# Patient Record
Sex: Male | Born: 1990 | Race: White | Hispanic: No | Marital: Single | Smoking: Former smoker
Health system: Southern US, Community
[De-identification: ages and names within clinical notes are randomized; demographics above are authoritative.]

## PROBLEM LIST (undated history)

## (undated) DIAGNOSIS — E785 Hyperlipidemia, unspecified: Secondary | ICD-10-CM

## (undated) DIAGNOSIS — F909 Attention-deficit hyperactivity disorder, unspecified type: Secondary | ICD-10-CM

---
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
## 2012-10-21 ENCOUNTER — Encounter: Payer: Self-pay | Admitting: Emergency Medicine

## 2012-10-21 ENCOUNTER — Emergency Department
Admission: EM | Admit: 2012-10-21 | Discharge: 2012-10-21 | Disposition: A | Discharge status: Home / Self Care | Payer: BC Managed Care – PPO | Source: Home / Self Care | Attending: Family Medicine | Admitting: Family Medicine

## 2012-10-21 DIAGNOSIS — J069 Acute upper respiratory infection, unspecified: Secondary | ICD-10-CM

## 2012-10-21 DIAGNOSIS — J029 Acute pharyngitis, unspecified: Secondary | ICD-10-CM

## 2012-10-21 LAB — POCT RAPID STREP A (OFFICE): Rapid Strep A Screen: NEGATIVE

## 2012-10-21 MED ORDER — AZITHROMYCIN 250 MG PO TABS: ORAL_TABLET | ORAL | Status: DC

## 2012-10-21 NOTE — ED Provider Notes (Signed)
CSN: [id number]     Arrival date & time 10/21/12  1628 History     First MD Initiated Contact with Patient 10/21/12 1654     Chief Complaint  Patient presents with  . Nasal Congestion  . Headache  . Otalgia  . Generalized Body Aches     HPI Comments: Reports 2-3 days of sniffling, congestion, alternating ear pain, headaches and body aches, with minor sore throat.  He is leaving for college in 5 days.  The history is provided by the patient.    History reviewed. No pertinent past medical history. History reviewed. No pertinent past surgical history. History reviewed. No pertinent family history. History  Substance Use Topics  . Smoking status: Not on file  . Smokeless tobacco: Not on file  . Alcohol Use: Not on file    Review of Systems + sore throat No cough No pleuritic pain No wheezing + nasal congestion ? post-nasal drainage No sinus pain/pressure No itchy/red eyes ? earache No hemoptysis No SOB No fever/chills No nausea No vomiting No abdominal pain No diarrhea No urinary symptoms No skin rashes No fatigue + myalgias + headache    Allergies  Review of patient's allergies indicates no known allergies.  Home Medications   Current Outpatient Rx  Name  Route  Sig  Dispense  Refill  . azithromycin (ZITHROMAX Z-PAK) 250 MG tablet      Take 2 tabs today; then begin one tab once daily for 4 more days. (Rx void after 10/29/12)   6 each   0    BP 117/72  Pulse 98  Temp(Src) 98.3 F (36.8 C) (Oral)  Resp 18  Ht 6\' 2"  (1.88 m)  Wt 170 lb (77.111 kg)  BMI 21.82 kg/m2  SpO2 100% Physical Exam Nursing notes and Vital Signs reviewed. Appearance:  Patient appears healthy, stated age, and in no acute distress Eyes:  Pupils are equal, round, and reactive to light and accomodation.  Extraocular movement is intact.  Conjunctivae are not inflamed  Ears:  Canals normal.  Tympanic membranes normal.  Nose:  Mildly congested turbinates.  No sinus tenderness.   Pharynx:  Normal Neck:  Supple.  Slightly tender shotty anterior/posterior nodes are palpated bilaterally  Lungs:  Clear to auscultation.  Breath sounds are equal.  Heart:  Regular rate and rhythm without murmurs, rubs, or gallops.  Abdomen:  Nontender without masses or hepatosplenomegaly.  Bowel sounds are present.  No CVA or flank tenderness.  Extremities:  No edema.  No calf tenderness Skin:  No rash present.   ED Course   Procedures  none  Labs Reviewed  STREP A DNA PROBE negative  POCT RAPID STREP A (OFFICE) pending    1. Acute pharyngitis   2. Viral upper respiratory infection     MDM  There is no evidence of bacterial infection today.   Throat culture pending If increasing cough and congestion develop: Begin Mucinex D (guaifenesin with decongestant) twice daily for congestion.  Increase fluid intake, rest. May use Afrin nasal spray (or generic oxymetazoline) twice daily for about 5 days.  Also recommend using saline nasal spray several times daily and saline nasal irrigation (AYR is a common brand) Stop all antihistamines for now, and other non-prescription cough/cold preparations. May take Delsym Cough Suppressant at bedtime for nighttime cough.  Begin Azithromycin if not improving about one week or if persistent fever develops (Given a prescription to hold, with an expiration date)  [facility] not improving  about10 to 14 days.  Lattie Haw, MD 10/21/12 [phone]

## 2012-10-21 NOTE — ED Notes (Signed)
Reports 2-3 days of sniffling, congestion, alternating ear pain, headaches and body aches, with minor sore throat.

## 2012-10-22 LAB — STREP A DNA PROBE: GASP: NEGATIVE

## 2012-10-24 ENCOUNTER — Telehealth: Payer: Self-pay | Admitting: *Deleted

## 2016-01-15 DIAGNOSIS — Z209 Contact with and (suspected) exposure to unspecified communicable disease: Secondary | ICD-10-CM | POA: Diagnosis not present

## 2016-01-15 DIAGNOSIS — Z0289 Encounter for other administrative examinations: Secondary | ICD-10-CM | POA: Diagnosis not present

## 2016-01-15 DIAGNOSIS — S2341XA Sprain of ribs, initial encounter: Secondary | ICD-10-CM | POA: Diagnosis not present

## 2016-03-21 DIAGNOSIS — Z6821 Body mass index (BMI) 21.0-21.9, adult: Secondary | ICD-10-CM | POA: Diagnosis not present

## 2016-03-21 DIAGNOSIS — R509 Fever, unspecified: Secondary | ICD-10-CM | POA: Diagnosis not present

## 2016-03-21 DIAGNOSIS — R52 Pain, unspecified: Secondary | ICD-10-CM | POA: Diagnosis not present

## 2016-03-21 DIAGNOSIS — J101 Influenza due to other identified influenza virus with other respiratory manifestations: Secondary | ICD-10-CM | POA: Diagnosis not present

## 2016-10-18 ENCOUNTER — Ambulatory Visit (INDEPENDENT_AMBULATORY_CARE_PROVIDER_SITE_OTHER): Payer: BC Managed Care – PPO | Admitting: Sports Medicine

## 2016-10-18 ENCOUNTER — Encounter: Payer: Self-pay | Admitting: Sports Medicine

## 2016-10-18 DIAGNOSIS — Z87891 Personal history of nicotine dependence: Secondary | ICD-10-CM

## 2016-10-18 DIAGNOSIS — Z87898 Personal history of other specified conditions: Secondary | ICD-10-CM

## 2016-10-18 DIAGNOSIS — F1011 Alcohol abuse, in remission: Secondary | ICD-10-CM | POA: Insufficient documentation

## 2016-10-18 DIAGNOSIS — Z0000 Encounter for general adult medical examination without abnormal findings: Secondary | ICD-10-CM | POA: Insufficient documentation

## 2016-10-18 LAB — TSH: TSH: 3.27 m[IU]/L (ref 0.40–4.50)

## 2016-10-18 LAB — CBC
HCT: 43.6 % (ref 38.5–50.0)
Hemoglobin: 14.8 g/dL (ref 13.2–17.1)
MCH: 29.3 pg (ref 27.0–33.0)
MCHC: 33.9 g/dL (ref 32.0–36.0)
MCV: 86.3 fL (ref 80.0–100.0)
MPV: 8.3 fL (ref 7.5–12.5)
Platelets: 264 K/uL (ref 140–400)
RBC: 5.05 MIL/uL (ref 4.20–5.80)
RDW: 12.8 % (ref 11.0–15.0)
WBC: 4.4 K/uL (ref 3.8–10.8)

## 2016-10-18 NOTE — Assessment & Plan Note (Signed)
Sober for a long time now. We certainly have options such as Campral and disulfiram if the desire comes back. Does have some weird tastes in his mouth, checking vitamin B12 levels in addition to the other above blood work.

## 2016-10-18 NOTE — Assessment & Plan Note (Signed)
Quit for one month now.

## 2016-10-18 NOTE — Assessment & Plan Note (Signed)
Adding routine labs. Up-to-date on vaccinations. Return in one year.

## 2016-10-18 NOTE — Progress Notes (Signed)
  Subjective:    CC: Establish care/New patient physical.   HPI:  Joseph Cole is a pleasant 26 year old male, he does have a history of alcohol abuse, smoking, he is several months sober from alcohol and he quit smoking last month. He really has no complaints. [place] [employer] [address], failed some classes, and is now enrolled in [employer] studying finance. Overall he is happy, doing well, has a girlfriend, and no complaints.  Past medical history:  Negative.  See flowsheet/record as well for more information.  Surgical history: Negative.  See flowsheet/record as well for more information.  Family history: Negative.  See flowsheet/record as well for more information.  Social history: Negative.  See flowsheet/record as well for more information.  Allergies, and medications have been entered into the medical record, reviewed, and no changes needed.    Review of Systems: No headache, visual changes, nausea, vomiting, diarrhea, constipation, dizziness, abdominal pain, skin rash, fevers, chills, night sweats, swollen lymph nodes, weight loss, chest pain, body aches, joint swelling, muscle aches, shortness of breath, mood changes, visual or auditory hallucinations.  Objective:    General: Well Developed, well nourished, and in no acute distress.  Neuro: Alert and oriented x3, extra-ocular muscles intact, sensation grossly intact. Cranial nerves II through XII are intact, motor, sensory, and coordinative functions are all intact. HEENT: Normocephalic, atraumatic, pupils equal round reactive to light, neck supple, no masses, no lymphadenopathy, thyroid nonpalpable. Oropharynx, nasopharynx, external ear canals are unremarkable. Skin: Warm and dry, no rashes noted.  Cardiac: Regular rate and rhythm, no murmurs rubs or gallops.  Respiratory: Clear to auscultation bilaterally. Not using accessory muscles, speaking in full sentences.  Abdominal: Soft, nontender, nondistended, positive bowel  sounds, no masses, no organomegaly.  Musculoskeletal: Shoulder, elbow, wrist, hip, knee, ankle stable, and with full range of motion.  Impression and Recommendations:    The patient was counselled, risk factors were discussed, anticipatory guidance given.  Annual physical exam Adding routine labs. Up-to-date on vaccinations. Return in one year.  Former smoker Quit for one month now.  History of alcohol abuse Sober for a long time now. We certainly have options such as Campral and disulfiram if the desire comes back. Does have some weird tastes in his mouth, checking vitamin B12 levels in addition to the other above blood work.

## 2016-10-19 LAB — COMPREHENSIVE METABOLIC PANEL WITH GFR
ALT: 17 U/L (ref 9–46)
Albumin: 4.9 g/dL (ref 3.6–5.1)
Alkaline Phosphatase: 118 U/L — ABNORMAL HIGH (ref 40–115)
BUN: 14 mg/dL (ref 7–25)
Potassium: 4.2 mmol/L (ref 3.5–5.3)
Total Protein: 7.6 g/dL (ref 6.1–8.1)

## 2016-10-19 LAB — LIPID PANEL W/REFLEX DIRECT LDL
Cholesterol: 169 mg/dL (ref <200)
HDL: 41 mg/dL (ref >40)
LDL-Cholesterol: 112 mg/dL — ABNORMAL HIGH
Non-HDL Cholesterol (Calc): 128 mg/dL (ref <130)
Total CHOL/HDL Ratio: 4.1 Ratio (ref <5.0)
Triglycerides: 74 mg/dL (ref <150)

## 2016-10-19 LAB — COMPREHENSIVE METABOLIC PANEL
AST: 14 U/L (ref 10–40)
CO2: 20 mmol/L (ref 20–32)
Calcium: 9.7 mg/dL (ref 8.6–10.3)
Chloride: 106 mmol/L (ref 98–110)
Creat: 0.82 mg/dL (ref 0.60–1.35)
Glucose, Bld: 93 mg/dL (ref 65–99)
Sodium: 140 mmol/L (ref 135–146)
Total Bilirubin: 0.4 mg/dL (ref 0.2–1.2)

## 2016-10-19 LAB — HEMOGLOBIN A1C
Hgb A1c MFr Bld: 4.9 % (ref <5.7)
Mean Plasma Glucose: 94 mg/dL

## 2016-10-19 LAB — HIV ANTIBODY (ROUTINE TESTING W REFLEX): HIV 1&2 Ab, 4th Generation: NONREACTIVE

## 2016-10-19 LAB — VITAMIN D 25 HYDROXY (VIT D DEFICIENCY, FRACTURES): Vit D, 25-Hydroxy: 28 ng/mL — ABNORMAL LOW (ref 30–100)

## 2016-10-19 LAB — VITAMIN B12: Vitamin B-12: 602 pg/mL (ref 200–1100)

## 2017-06-20 ENCOUNTER — Encounter: Payer: Self-pay | Admitting: Sports Medicine

## 2017-06-20 ENCOUNTER — Ambulatory Visit (INDEPENDENT_AMBULATORY_CARE_PROVIDER_SITE_OTHER): Payer: BLUE CROSS/BLUE SHIELD | Admitting: Sports Medicine

## 2017-06-20 ENCOUNTER — Ambulatory Visit: Payer: BC Managed Care – PPO | Admitting: Sports Medicine

## 2017-06-20 DIAGNOSIS — L74513 Primary focal hyperhidrosis, soles: Secondary | ICD-10-CM

## 2017-06-20 DIAGNOSIS — L74512 Primary focal hyperhidrosis, palms: Secondary | ICD-10-CM | POA: Diagnosis not present

## 2017-06-20 MED ORDER — ALUMINUM CHLORIDE 20 % EX SOLN
CUTANEOUS | 3 refills | Status: DC
Start: 2017-06-20 — End: 2017-06-22

## 2017-06-20 NOTE — Patient Instructions (Signed)
Hyperhidrosis  It is normal to sweat when you are hot, being physically active, or feeling anxious. Sweating is a necessary function for your body. However, hyperhidrosis is when you sweat too much (excessively). Although hyperhidrosis is not dangerous, it can make you feel embarrassed.  There are two kinds of hyperhidrosis:   Primary hyperhidrosis. The sweating usually localizes in one part of your body, such as your underarms, or in a few areas, such as your feet, face, armpits, and hands. This is the more common kind of hyperhidrosis.   Secondary hyperhidrosis. This type more likely affects your entire body.    What are the causes?  The cause of your hyperhidrosis depends on the kind you have.   Primary hyperhidrosis may be caused by having sweat glands that are more active than normal.   Secondary hyperhidrosis is caused by an underlying condition. Possible conditions include:  ? Diabetes.  ? Gout.  ? Certain medicines.  ? Anxiety.  ? Stroke.  ? Obesity.  ? Menopause.  ? Overactive thyroid (hyperthyroidism).  ? Tumors.  ? Frostbite.  ? Certain types of cancers.  ? Alcoholism.  ? Injury to your nervous system.  ? Stroke.  ? Parkinson disease.    What increases the risk?  You may be at an increased risk for primary hyperhidrosis if you have a family history of it.  What are the signs or symptoms?  General symptoms of hyperhidrosis may include:   Feeling like you are sweating constantly, even while you are resting.   Having skin that peels or gets paler or softer in the areas where you sweat the most.   Being able to see sweat on your skin.    Symptoms of primary hyperhidrosis may include:   Sweating in specific areas, such as your armpits, palms, feet, and face.   Sweating in the same location on both sides of your body.   Sweating only during the day.    Symptoms of secondary hyperhidrosis may include:   Sweating all over your body.   Sweating even while you sleep.    How is this  diagnosed?  Hyperhidrosis may be diagnosed by:   Medical history and physical exam.   Testing, such as:  ? Sweat test.  ? Paper test.    How is this treated?  Your treatment will depend on the kind of hyperhidrosis you have and the parts of your body that are affected. If your hyperhidrosis is caused by an underlying condition, your treatment will address the cause. Treatment may include:   Strong antiperspirants. Your health care provider may give you a prescription.   Medicines taken by mouth.   Medicines injected by your health care provider. These may include small amounts of botulinum toxin.   Iontophoresis. This is a procedure that temporarily turns off the sweat glands in your hands and feet.   Surgery to remove your sweat glands.   Sympathectomy. This is a procedure that cuts or destroys your nerves so that they do not send a signal to sweat.    Follow these instructions at home:   Take medicines only as directed by your health care provider.   Use antiperspirants as directed by your health care provider.   Limit or avoid foods or beverages that seem to increase your chances of sweating, such as:  ? Spicy food.  ? Caffeine.  ? Alcohol.  ? Foods that contain MSG.   If your feet sweat:  ? Wear sandals, when possible.  ?   health care provider. Make sure you discuss any questions you have with your health care provider. Document Released: 04/28/2005 Document Revised: 08/05/2015 Document Reviewed: 10/07/2013 Elsevier Interactive Patient Education  2018 [employer].   Aluminum Chloride topical solution What is this  medicine? Aluminum Chloride (a LOO mi num klor ide) is used to control excessive sweating. This medicine may be used for other purposes; ask your health care provider or pharmacist if you have questions. COMMON BRAND NAME(S): Drysol, Hypercare, Darleene Cleaver What should I tell my health care provider before I take this medicine? They need to know if you have any of these conditions: -an unusual or allergic reaction to aluminum chloride, other medicines, foods, dyes, or preservatives -pregnant or trying to get pregnant -breast-feeding How should I use this medicine? This medicine is for external use only. Follow the directions on the prescription label. Make sure the skin is dry before use. Apply to the affected area as directed by your doctor or health care professional, usually at bedtime. Avoid contact with broken, irritated or recently shaved skin. Do not use your medicine more often than directed. Talk to your pediatrician regarding the use of this medicine in children. Special care may be needed. Overdosage: If you think you have taken too much of this medicine contact a poison control center or emergency room at once. NOTE: This medicine is only for you. Do not share this medicine with others. What if I miss a dose? If you miss a dose, use it as soon as you can. If it is almost time for your next dose, use only that dose. Do not use double or extra doses. What may interact with this medicine? Interactions are not expected. Do not use any other skin products on the affected area without asking your doctor or health care professional. This list may not describe all possible interactions. Give your health care provider a list of all the medicines, herbs, non-prescription drugs, or dietary supplements you use. Also tell them if you smoke, drink alcohol, or use illegal drugs. Some items may interact with your medicine. What should I watch for while using this medicine? You may notice a decrease in  sweating after two treatments. Call your doctor or health care professional if your condition does not start to get better or if it gets worse. To help increase the effect of this medicine, your doctor or health care professional may tell you to cover the treated area with saran wrap held in place by a snug fitting t-shirt, mitten or sock. Do not use tape to hold the saran wrap in place. This medicine may discolor fabrics and may be harmful to certain metals. Avoid contact with clothing and jewelry. Do not use this medicine near open flame. What side effects may I notice from receiving this medicine? Side effects that you should report to your doctor or health care professional as soon as possible: -allergic reactions like skin rash, itching or hives, swelling of the face, lips, or tongue -excessive irritation or sensitivity Side effects that usually do not require medical attention (report to your doctor or health care professional if they continue or are bothersome): -mild irritation This list may not describe all possible side effects. Call your doctor for medical advice about side effects. You may report side effects to FDA at 1-800-FDA-1088. Where should I keep my medicine? Keep out of the reach of children. Store at room temperature between 15 and 30 degrees C (59 and 86 degrees C). Throw away  any unused medicine after the expiration date. NOTE: This sheet is a summary. It may not cover all possible information. If you have questions about this medicine, talk to your doctor, pharmacist, or health care provider.  2018 Elsevier/Gold Standard (2013-01-01 17:30:08)

## 2017-06-20 NOTE — Progress Notes (Signed)
Subjective:    CC: Sweaty palms and soles  HPI: This is a pleasant 27 year old male, all of his life he has had excessively sweaty palms, soles to the point it is embarrassing to make physical contact.  He really does not have any issues with sweating in the axilla, back, chest.  Over-the-counter antiperspirants have been ineffective and he is wondering if we have a more efficacious solution.  Symptoms are severe, persistent.  I reviewed the past medical history, family history, social history, surgical history, and allergies today and no changes were needed.  Please see the problem list section below in epic for further details.  Past Medical History: No past medical history on file. Past Surgical History: No past surgical history on file. Social History: Social History   Socioeconomic History  . Marital status: Single    Spouse name: Not on file  . Number of children: Not on file  . Years of education: Not on file  . Highest education level: Not on file  Occupational History  . Not on file  Social Needs  . Financial resource strain: Not on file  . Food insecurity:    Worry: Not on file    Inability: Not on file  . Transportation needs:    Medical: Not on file    Non-medical: Not on file  Tobacco Use  . Smoking status: Former Smoker    Types: Cigarettes    Last attempt to quit: 09/12/2016    Years since quitting: 0.7  . Smokeless tobacco: Never Used  Substance and Sexual Activity  . Alcohol use: Not on file  . Drug use: No  . Sexual activity: Yes    Partners: Female  Lifestyle  . Physical activity:    Days per week: Not on file    Minutes per session: Not on file  . Stress: Not on file  Relationships  . Social connections:    Talks on phone: Not on file    Gets together: Not on file    Attends religious service: Not on file    Active member of club or organization: Not on file    Attends meetings of clubs or organizations: Not on file    Relationship status:  Not on file  Other Topics Concern  . Not on file  Social History Narrative  . Not on file   Family History: Family History  Problem Relation Age of Onset  . Depression Mother   . Hypercholesterolemia Mother   . Hypertension Father   . Depression Sister    Allergies: No Known Allergies Medications: See med rec.  Review of Systems: No fevers, chills, night sweats, weight loss, chest pain, or shortness of breath.   Objective:    General: Well Developed, well nourished, and in no acute distress.  Neuro: Alert and oriented x3, extra-ocular muscles intact, sensation grossly intact.  HEENT: Normocephalic, atraumatic, pupils equal round reactive to light, neck supple, no masses, no lymphadenopathy, thyroid nonpalpable.  Skin: Warm and dry, no rashes.  His palms feel very sweaty and shaking his hand. Cardiac: Regular rate and rhythm, no murmurs rubs or gallops, no lower extremity edema.  Respiratory: Clear to auscultation bilaterally. Not using accessory muscles, speaking in full sentences.  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  I spent 25 minutes with this patient, greater than 50% was face-to-face time counseling regarding the above diagnoses ___________________________________________ Ihor Austinhomas J. Benjamin Stainhekkekandam, M.D., ABFM., CAQSM. Primary [facility] Sports [facility]  [place] Instructor of [employer] [facility] [employer]

## 2017-06-20 NOTE — Assessment & Plan Note (Signed)
Adding topical Drysol. 

## 2017-06-22 ENCOUNTER — Encounter: Payer: Self-pay | Admitting: Sports Medicine

## 2017-06-22 DIAGNOSIS — L74513 Primary focal hyperhidrosis, soles: Principal | ICD-10-CM

## 2017-06-22 DIAGNOSIS — L74512 Primary focal hyperhidrosis, palms: Secondary | ICD-10-CM

## 2017-06-22 MED ORDER — ALUMINUM CHLORIDE 20 % EX SOLN: CUTANEOUS | 3 refills | Status: AC

## 2017-06-22 MED FILL — DRYSOL DAB-O-MATIC SOLUTION: 20 | 30 days supply | Qty: 35 | Fill #0

## 2017-10-11 DIAGNOSIS — F419 Anxiety disorder, unspecified: Secondary | ICD-10-CM | POA: Diagnosis not present

## 2017-11-02 ENCOUNTER — Ambulatory Visit: Payer: BLUE CROSS/BLUE SHIELD | Admitting: Sports Medicine

## 2017-11-02 DIAGNOSIS — F329 Major depressive disorder, single episode, unspecified: Secondary | ICD-10-CM | POA: Diagnosis not present

## 2017-11-02 DIAGNOSIS — L74513 Primary focal hyperhidrosis, soles: Secondary | ICD-10-CM | POA: Diagnosis not present

## 2017-11-02 DIAGNOSIS — F32A Depression, unspecified: Secondary | ICD-10-CM | POA: Insufficient documentation

## 2017-11-02 DIAGNOSIS — L74512 Primary focal hyperhidrosis, palms: Secondary | ICD-10-CM

## 2017-11-02 DIAGNOSIS — F419 Anxiety disorder, unspecified: Secondary | ICD-10-CM

## 2017-11-02 MED ORDER — SERTRALINE HCL 50 MG PO TABS
50.0000 mg | ORAL_TABLET | Freq: Every day | ORAL | 3 refills | Status: DC
Start: 2017-11-02 — End: 2017-12-07

## 2017-11-02 NOTE — Assessment & Plan Note (Signed)
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 Referral to dermatology to discuss Botox treatment.

## 2017-11-02 NOTE — Assessment & Plan Note (Signed)
Continue behavioral therapy, adding Zoloft 50. Return to see me in 4 weeks to repeat PHQ and GAD.

## 2017-11-02 NOTE — Patient Instructions (Signed)
OnabotulinumtoxinA injection (Medical Use) What is this medicine? ONABOTULINUMTOXINA (o na BOTT you lye num tox in eh) is a neuro-muscular blocker. This medicine is used to treat crossed eyes, eyelid spasms, severe neck muscle spasms, ankle and toe muscle spasms, and elbow, wrist, and finger muscle spasms. It is also used to treat excessive underarm sweating, to prevent chronic migraine headaches, and to treat loss of bladder control due to neurologic conditions such as multiple sclerosis or spinal cord injury. This medicine may be used for other purposes; ask your health care provider or pharmacist if you have questions. COMMON BRAND NAME(S): Botox What should I tell my health care provider before I take this medicine? They need to know if you have any of these conditions: -breathing problems -cerebral palsy spasms -difficulty urinating -heart problems -history of surgery where this medicine is going to be used -infection at the site where this medicine is going to be used -myasthenia gravis or other neurologic disease -nerve or muscle disease -surgery plans -take medicines that treat or prevent blood clots -thyroid problems -an unusual or allergic reaction to botulinum toxin, albumin, other medicines, foods, dyes, or preservatives -pregnant or trying to get pregnant -breast-feeding How should I use this medicine? This medicine is for injection into a muscle. It is given by a health care professional in a hospital or clinic setting. Talk to your pediatrician regarding the use of this medicine in children. While this drug may be prescribed for children as young as 12 years old for selected conditions, precautions do apply. Overdosage: If you think you have taken too much of this medicine contact a poison control center or emergency room at once. NOTE: This medicine is only for you. Do not share this medicine with others. What if I miss a dose? This does not apply. What may interact with  this medicine? -aminoglycoside antibiotics like gentamicin, neomycin, tobramycin -muscle relaxants -other botulinum toxin injections This list may not describe all possible interactions. Give your health care provider a list of all the medicines, herbs, non-prescription drugs, or dietary supplements you use. Also tell them if you smoke, drink alcohol, or use illegal drugs. Some items may interact with your medicine. What should I watch for while using this medicine? Visit your doctor for regular check ups. This medicine will cause weakness in the muscle where it is injected. Tell your doctor if you feel unusually weak in other muscles. Get medical help right away if you have problems with breathing, swallowing, or talking. This medicine might make your eyelids droop or make you see blurry or double. If you have weak muscles or trouble seeing do not drive a car, use machinery, or do other dangerous activities. This medicine contains albumin from human blood. It may be possible to pass an infection in this medicine, but no cases have been reported. Talk to your doctor about the risks and benefits of this medicine. If your activities have been limited by your condition, go back to your regular routine slowly after treatment with this medicine. What side effects may I notice from receiving this medicine? Side effects that you should report to your doctor or health care professional as soon as possible: -allergic reactions like skin rash, itching or hives, swelling of the face, lips, or tongue -breathing problems -changes in vision -chest pain or tightness -eye irritation, pain -fast, irregular heartbeat -infection -numbness -speech problems -swallowing problems -unusual weakness Side effects that usually do not require medical attention (report to your doctor or health care   professional if they continue or are bothersome): -bruising or pain at site where injected -drooping eyelid -dry eyes or  mouth -headache -muscles aches, pains -sensitivity to light -tearing This list may not describe all possible side effects. Call your doctor for medical advice about side effects. You may report side effects to FDA at 1-800-FDA-1088. Where should I keep my medicine? This drug is given in a hospital or clinic and will not be stored at home. NOTE: This sheet is a summary. It may not cover all possible information. If you have questions about this medicine, talk to your doctor, pharmacist, or health care provider.  2018 Elsevier/Gold Standard (2014-04-07 15:43:53)  

## 2017-11-02 NOTE — Progress Notes (Signed)
Subjective:    CC: Anxiety  HPI: Joseph Cole is a pleasant 27 year old male, he has a long history of feeling uneasy, anxiety symptoms, mild panic.  We have been trying a nonpharmacologic approach initially with behavioral therapy which has been moderately efficacious.  Fortunately he continues to have a baseline level of uneasiness that he is not okay with.  His girlfriend has been taking Zoloft and he is wondering if this would be useful for him.  No suicidal or homicidal ideation.  Hyperhidrosis: Palms and soles, initial good response to Drysol, but now the effect is waning, even with daily use.  I reviewed the past medical history, family history, social history, surgical history, and allergies today and no changes were needed.  Please see the problem list section below in epic for further details.  Past Medical History: No past medical history on file. Past Surgical History: No past surgical history on file. Social History: Social History   Socioeconomic History  . Marital status: Single    Spouse name: Not on file  . Number of children: Not on file  . Years of education: Not on file  . Highest education level: Not on file  Occupational History  . Not on file  Social Needs  . Financial resource strain: Not on file  . Food insecurity:    Worry: Not on file    Inability: Not on file  . Transportation needs:    Medical: Not on file    Non-medical: Not on file  Tobacco Use  . Smoking status: Former Smoker    Types: Cigarettes    Last attempt to quit: 09/12/2016    Years since quitting: 1.1  . Smokeless tobacco: Never Used  Substance and Sexual Activity  . Alcohol use: Not on file  . Drug use: No  . Sexual activity: Yes    Partners: Female  Lifestyle  . Physical activity:    Days per week: Not on file    Minutes per session: Not on file  . Stress: Not on file  Relationships  . Social connections:    Talks on phone: Not on file    Gets together: Not on file    Attends  religious service: Not on file    Active member of club or organization: Not on file    Attends meetings of clubs or organizations: Not on file    Relationship status: Not on file  Other Topics Concern  . Not on file  Social History Narrative  . Not on file   Family History: Family History  Problem Relation Age of Onset  . Depression Mother   . Hypercholesterolemia Mother   . Hypertension Father   . Depression Sister    Allergies: No Known Allergies Medications: See med rec.  Review of Systems: No fevers, chills, night sweats, weight loss, chest pain, or shortness of breath.   Objective:    General: Well Developed, well nourished, and in no acute distress.  Neuro: Alert and oriented x3, extra-ocular muscles intact, sensation grossly intact.  HEENT: Normocephalic, atraumatic, pupils equal round reactive to light, neck supple, no masses, no lymphadenopathy, thyroid nonpalpable.  Skin: Warm and dry, no rashes. Cardiac: Regular rate and rhythm, no murmurs rubs or gallops, no lower extremity edema.  Respiratory: Clear to auscultation bilaterally. Not using accessory muscles, speaking in full sentences.  Impression and Recommendations:    Anxiety and depression Continue behavioral therapy, adding Zoloft 50. Return to see me in 4 weeks to repeat PHQ and GAD.  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 Referral to dermatology to discuss Botox treatment.  I spent 25 minutes with this patient, greater than 50% was face-to-face time counseling regarding the above diagnoses ___________________________________________ Ihor Austinhomas J. Benjamin Stainhekkekandam, M.D., ABFM., CAQSM. Primary Care and Sports Medicine [facility]  Adjunct Instructor of Family Medicine  [employer]

## 2017-11-16 DIAGNOSIS — F419 Anxiety disorder, unspecified: Secondary | ICD-10-CM | POA: Diagnosis not present

## 2017-12-07 ENCOUNTER — Ambulatory Visit (INDEPENDENT_AMBULATORY_CARE_PROVIDER_SITE_OTHER): Payer: BLUE CROSS/BLUE SHIELD | Admitting: Sports Medicine

## 2017-12-07 ENCOUNTER — Encounter: Payer: Self-pay | Admitting: Sports Medicine

## 2017-12-07 DIAGNOSIS — F32A Depression, unspecified: Secondary | ICD-10-CM

## 2017-12-07 DIAGNOSIS — F419 Anxiety disorder, unspecified: Secondary | ICD-10-CM

## 2017-12-07 DIAGNOSIS — F329 Major depressive disorder, single episode, unspecified: Secondary | ICD-10-CM

## 2017-12-07 DIAGNOSIS — K219 Gastro-esophageal reflux disease without esophagitis: Secondary | ICD-10-CM | POA: Diagnosis not present

## 2017-12-07 MED ORDER — SERTRALINE HCL 100 MG PO TABS: 100.0000 mg | ORAL_TABLET | Freq: Every day | ORAL | 3 refills | Status: DC

## 2017-12-07 MED ORDER — PANTOPRAZOLE SODIUM 40 MG PO TBEC: 40.0000 mg | DELAYED_RELEASE_TABLET | Freq: Every evening | ORAL | 3 refills | Status: AC

## 2017-12-07 NOTE — Assessment & Plan Note (Signed)
No hematochezia, hematemesis, melena. Counseled to cut back on intake of alcohol and foods within 2 hours of bedtime. Adding Protonix. Recheck in 4 weeks.

## 2017-12-07 NOTE — Progress Notes (Signed)
Subjective:    CC: Follow-up  HPI: Joseph Cole returns to discuss his anxiety and depression.  We started Zoloft 50 the last visit he continued with behavioral therapy.  He has noted some improvements but would like to go up on his Zoloft dose.  No suicidal or homicidal ideation.  In addition he is noted significant sour brash in the mornings, only drinks a beer occasionally in the evenings, does not eat within 2 hours of bedtime, no melena, hematemesis, hematochezia.  No epigastric pain, mostly just sour brash in the mornings.  I reviewed the past medical history, family history, social history, surgical history, and allergies today and no changes were needed.  Please see the problem list section below in epic for further details.  Past Medical History: No past medical history on file. Past Surgical History: No past surgical history on file. Social History: Social History   Socioeconomic History  . Marital status: Single    Spouse name: Not on file  . Number of children: Not on file  . Years of education: Not on file  . Highest education level: Not on file  Occupational History  . Not on file  Social Needs  . Financial resource strain: Not on file  . Food insecurity:    Worry: Not on file    Inability: Not on file  . Transportation needs:    Medical: Not on file    Non-medical: Not on file  Tobacco Use  . Smoking status: Former Smoker    Types: Cigarettes    Last attempt to quit: 09/12/2016    Years since quitting: 1.2  . Smokeless tobacco: Never Used  Substance and Sexual Activity  . Alcohol use: Not on file  . Drug use: No  . Sexual activity: Yes    Partners: Female  Lifestyle  . Physical activity:    Days per week: Not on file    Minutes per session: Not on file  . Stress: Not on file  Relationships  . Social connections:    Talks on phone: Not on file    Gets together: Not on file    Attends religious service: Not on file    Active member of club or organization:  Not on file    Attends meetings of clubs or organizations: Not on file    Relationship status: Not on file  Other Topics Concern  . Not on file  Social History Narrative  . Not on file   Family History: Family History  Problem Relation Age of Onset  . Depression Mother   . Hypercholesterolemia Mother   . Hypertension Father   . Depression Sister    Allergies: No Known Allergies Medications: See med rec.  Review of Systems: No fevers, chills, night sweats, weight loss, chest pain, or shortness of breath.   Objective:    General: Well Developed, well nourished, and in no acute distress.  Neuro: Alert and oriented x3, extra-ocular muscles intact, sensation grossly intact.  HEENT: Normocephalic, atraumatic, pupils equal round reactive to light, neck supple, no masses, no lymphadenopathy, thyroid nonpalpable.  Skin: Warm and dry, no rashes. Cardiac: Regular rate and rhythm, no murmurs rubs or gallops, no lower extremity edema.  Respiratory: Clear to auscultation bilaterally. Not using accessory muscles, speaking in full sentences.  Impression and Recommendations:    Anxiety and depression Increasing Zoloft 100 mg. Return in 1 month for PHQ and GAD.  GERD (gastroesophageal reflux disease) No hematochezia, hematemesis, melena. Counseled to cut back on intake of  alcohol and foods within 2 hours of bedtime. Adding Protonix. Recheck in 4 weeks.  ___________________________________________ Ihor Austin. Benjamin Stain, M.D., ABFM., CAQSM. Primary Care and Sports Medicine [facility]  Adjunct Instructor of Family Medicine  [employer]

## 2017-12-07 NOTE — Assessment & Plan Note (Signed)
Increasing Zoloft 100 mg. Return in 1 month for PHQ and GAD.

## 2017-12-31 ENCOUNTER — Other Ambulatory Visit: Payer: Self-pay

## 2017-12-31 ENCOUNTER — Encounter: Payer: Self-pay | Admitting: *Deleted

## 2017-12-31 ENCOUNTER — Emergency Department (INDEPENDENT_AMBULATORY_CARE_PROVIDER_SITE_OTHER): Payer: BLUE CROSS/BLUE SHIELD

## 2017-12-31 ENCOUNTER — Emergency Department
Admission: EM | Admit: 2017-12-31 | Discharge: 2017-12-31 | Disposition: A | Discharge status: Home / Self Care | Payer: BLUE CROSS/BLUE SHIELD | Source: Home / Self Care | Attending: Family Medicine | Admitting: Family Medicine

## 2017-12-31 DIAGNOSIS — R05 Cough: Secondary | ICD-10-CM | POA: Diagnosis not present

## 2017-12-31 DIAGNOSIS — R509 Fever, unspecified: Secondary | ICD-10-CM | POA: Diagnosis not present

## 2017-12-31 DIAGNOSIS — J209 Acute bronchitis, unspecified: Secondary | ICD-10-CM | POA: Diagnosis not present

## 2017-12-31 LAB — POCT RAPID STREP A (OFFICE): RAPID STREP A SCREEN: NEGATIVE

## 2017-12-31 MED ORDER — DOXYCYCLINE HYCLATE 100 MG PO CAPS: 100.0000 mg | ORAL_CAPSULE | Freq: Two times a day (BID) | ORAL | 0 refills | Status: DC

## 2017-12-31 MED ORDER — BENZONATATE 200 MG PO CAPS: ORAL_CAPSULE | ORAL | 0 refills | Status: DC

## 2017-12-31 NOTE — ED Triage Notes (Signed)
Pt c/o productive cough x 10days; fever now. He has taken Dayquil and Nyquil without relief.

## 2017-12-31 NOTE — ED Provider Notes (Signed)
[facility]    CSN: [id number] Arrival date & time: 12/31/17  1106     History   Chief Complaint Chief Complaint  Patient presents with  . Cough    HPI Joseph Cole is a 27 y.o. male.   Patient developed a cough and sinus congestion 10 days ago.  The sinus congestion resolved but the cough has persisted, and today he developed sore throat and fever.  He has had fatigue and myalgias.  No pleuritic pain or shortness of breath.  The history is provided by the patient.    History reviewed. No pertinent past medical history.  Patient Active Problem List   Diagnosis Date Noted  . GERD (gastroesophageal reflux disease) 12/07/2017  . Anxiety and depression 11/02/2017  . Hyperhidrosis of palms and soles 06/20/2017  . Annual physical exam 10/18/2016  . Former smoker 10/18/2016  . History of alcohol abuse 10/18/2016    History reviewed. No pertinent surgical history.     Home Medications    Prior to Admission medications   Medication Sig Start Date End Date Taking? Authorizing Provider  aluminum chloride (DRYSOL) 20 % external solution Apply daily at bedtime for 3 days then weekly to areas of excessive sweat. 06/22/17   Monica Becton, MD  benzonatate (TESSALON) 200 MG capsule Take one cap by mouth at bedtime as needed for cough.  May repeat in 4 to 6 hours 12/31/17   Lattie Haw, MD  doxycycline (VIBRAMYCIN) 100 MG capsule Take 1 capsule (100 mg total) by mouth 2 (two) times daily. Take with food. 12/31/17   Lattie Haw, MD  pantoprazole (PROTONIX) 40 MG tablet Take 1 tablet (40 mg total) by mouth every evening. 12/07/17   Monica Becton, MD  sertraline (ZOLOFT) 100 MG tablet Take 1 tablet (100 mg total) by mouth daily. 12/07/17   Monica Becton, MD    Family History Family History  Problem Relation Age of Onset  . Depression Mother   . Hypercholesterolemia Mother   . Hypertension Father   . Depression Sister     Social  History Social History   Tobacco Use  . Smoking status: Former Smoker    Types: Cigarettes    Last attempt to quit: 09/12/2016    Years since quitting: 1.3  . Smokeless tobacco: Never Used  Substance Use Topics  . Alcohol use: Never    Frequency: Never  . Drug use: No     Allergies   Patient has no known allergies.   Review of Systems Review of Systems + sore throat + cough No pleuritic pain No wheezing + nasal congestion + post-nasal drainage No sinus pain/pressure No itchy/red eyes No earache No hemoptysis No SOB + fever, no chills No nausea No vomiting No abdominal pain No diarrhea No urinary symptoms No skin rash + fatigue + myalgias + headache Used OTC meds without relief   Physical Exam Triage Vital Signs ED Triage Vitals  Enc Vitals Group     BP 12/31/17 1139 114/77     Pulse Rate 12/31/17 1139 92     Resp 12/31/17 1139 18     Temp 12/31/17 1139 (!) 100.5 F (38.1 C)     Temp Source 12/31/17 1139 Oral     SpO2 12/31/17 1139 99 %     Weight 12/31/17 1140 173 lb (78.5 kg)     Height 12/31/17 1140 6\' 2"  (1.88 m)     Head Circumference --  Peak Flow --      Pain Score 12/31/17 1140 0     Pain Loc --      Pain Edu? --      Excl. in GC? --    No data found.  Updated Vital Signs BP 114/77 (BP Location: Right Arm)   Pulse 92   Temp (!) 100.5 F (38.1 C) (Oral)   Resp 18   Ht 6\' 2"  (1.88 m)   Wt 78.5 kg   SpO2 99%   BMI 22.21 kg/m   Visual Acuity Right Eye Distance:   Left Eye Distance:   Bilateral Distance:    Right Eye Near:   Left Eye Near:    Bilateral Near:     Physical Exam Nursing notes and Vital Signs reviewed. Appearance:  Patient appears stated age, and in no acute distress Eyes:  Pupils are equal, round, and reactive to light and accomodation.  Extraocular movement is intact.  Conjunctivae are not inflamed  Ears:  Canals normal.  Tympanic membranes normal.  Nose:  Mildly congested turbinates.  No sinus tenderness.     Pharynx:  Erythematous Neck:  Supple.   Tender mildly enlarged left tonsillar node.   Lungs:  Clear to auscultation.  Breath sounds are equal.  Moving air well. Heart:  Regular rate and rhythm without murmurs, rubs, or gallops.  Abdomen:  Nontender without masses or hepatosplenomegaly.  Bowel sounds are present.  No CVA or flank tenderness.  Extremities:  No edema.  Skin:  No rash present.    UC Treatments / Results  Labs (all labs ordered are listed, but only abnormal results are displayed) Labs Reviewed  POCT RAPID STREP A (OFFICE)    EKG None  Radiology Dg Chest 2 View  Result Date: 12/31/2017 CLINICAL DATA:  27 year old male with cough for the past 10 days and new onset fever EXAM: CHEST - 2 VIEW COMPARISON:  None. FINDINGS: The lungs are clear and negative for focal airspace consolidation, pulmonary edema or suspicious pulmonary nodule. No pleural effusion or pneumothorax. Cardiac and mediastinal contours are within normal limits. No acute fracture or lytic or blastic osseous lesions. The visualized upper abdominal bowel gas pattern is unremarkable. IMPRESSION: Negative chest x-ray. Electronically Signed   By: Malachy Moan M.D.   On: 12/31/2017 12:10    Procedures Procedures (including critical care time)  Medications Ordered in UC Medications - No data to display  Initial Impression / Assessment and Plan / UC Course  I have reviewed the triage vital signs and the nursing notes.  Pertinent labs & imaging results that were available during my care of the patient were reviewed by me and considered in my medical decision making (see chart for details).    Begin doxycycline for atypical coverage. Prescription written for Benzonatate [facility]) to take at bedtime for night-time cough.  Followup with Family Doctor if not improved in one week.   Final Clinical Impressions(s) / UC Diagnoses   Final diagnoses:  Acute bronchitis, unspecified organism     Discharge  Instructions     Take plain guaifenesin (1200mg  extended release tabs such as Mucinex) twice daily, with plenty of water, for cough and congestion.  May add Pseudoephedrine (30mg , one or two every 4 to 6 hours) for sinus congestion.  Get adequate rest.   May use Afrin nasal spray (or generic oxymetazoline) each morning for about 5 days and then discontinue.  Also recommend using saline nasal spray several times daily and saline nasal irrigation (AYR  is a common brand).   Try warm salt water gargles for sore throat.  Stop all antihistamines for now, and other non-prescription cough/cold preparations. May take Ibuprofen 200mg , 4 tabs every 8 hours with food for fever, headache, body aches, etc. May take Delsym Cough Suppressant with Tessalon at bedtime for nighttime cough.         ED Prescriptions    Medication Sig Dispense Auth. Provider   doxycycline (VIBRAMYCIN) 100 MG capsule Take 1 capsule (100 mg total) by mouth 2 (two) times daily. Take with food. 14 capsule Lattie Haw, MD   benzonatate (TESSALON) 200 MG capsule Take one cap by mouth at bedtime as needed for cough.  May repeat in 4 to 6 hours 15 capsule Cathren Harsh Tera Mater, MD         Lattie Haw, MD 12/31/17 1239

## 2017-12-31 NOTE — Discharge Instructions (Addendum)
Take plain guaifenesin (1200mg  extended release tabs such as Mucinex) twice daily, with plenty of water, for cough and congestion.  May add Pseudoephedrine (30mg , one or two every 4 to 6 hours) for sinus congestion.  Get adequate rest.   May use Afrin nasal spray (or generic oxymetazoline) each morning for about 5 days and then discontinue.  Also recommend using saline nasal spray several times daily and saline nasal irrigation (AYR is a common brand).   Try warm salt water gargles for sore throat.  Stop all antihistamines for now, and other non-prescription cough/cold preparations. May take Ibuprofen 200mg , 4 tabs every 8 hours with food for fever, headache, body aches, etc. May take Delsym Cough Suppressant with Tessalon at bedtime for nighttime cough.

## 2018-01-01 ENCOUNTER — Telehealth: Payer: Self-pay

## 2018-01-01 LAB — STREP A DNA PROBE: GROUP A STREP PROBE: DETECTED — AB

## 2018-01-01 NOTE — Telephone Encounter (Signed)
Spoke with Dr Cathren Harsh, and Amox. 875mg  #20, 1 BID no refills called to pharmacy.

## 2018-01-04 ENCOUNTER — Encounter: Payer: Self-pay | Admitting: Sports Medicine

## 2018-01-04 ENCOUNTER — Ambulatory Visit (INDEPENDENT_AMBULATORY_CARE_PROVIDER_SITE_OTHER): Payer: BLUE CROSS/BLUE SHIELD | Admitting: Sports Medicine

## 2018-01-04 DIAGNOSIS — K219 Gastro-esophageal reflux disease without esophagitis: Secondary | ICD-10-CM | POA: Diagnosis not present

## 2018-01-04 DIAGNOSIS — F419 Anxiety disorder, unspecified: Secondary | ICD-10-CM | POA: Diagnosis not present

## 2018-01-04 DIAGNOSIS — F329 Major depressive disorder, single episode, unspecified: Secondary | ICD-10-CM | POA: Diagnosis not present

## 2018-01-04 DIAGNOSIS — F32A Depression, unspecified: Secondary | ICD-10-CM

## 2018-01-04 MED ORDER — SERTRALINE HCL 50 MG PO TABS: 150.0000 mg | ORAL_TABLET | Freq: Every day | ORAL | 3 refills | Status: DC

## 2018-01-04 NOTE — Assessment & Plan Note (Signed)
Depressive symptoms now completely resolved after increasing to 100 mg of Zoloft. Only minimal anxiety symptoms in social situations. Rather than adding a medicine for as needed use he would like to go up on the Zoloft. Increasing to 150 mg, follow-up in 1 month. Repeat PHQ and GAD.

## 2018-01-04 NOTE — Assessment & Plan Note (Signed)
Occasionally will have a beer or food within 2 hours of bedtime but when he sticks to the strict 2-hour no intake before bedtime rule he has no further acid reflux symptoms with the Protonix.

## 2018-01-04 NOTE — Progress Notes (Signed)
Subjective:    CC: Follow-up  HPI: Anxiety depression: Improved considerably with the increase to 100 mg, depressive symptoms are gone, still has a bit of anxiety symptoms in social situations, he does desire more to go up on his Zoloft rather than try a medication for use as needed.  No suicidal or homicidal ideation.  GERD: Symptoms are now for the most part controlled with pantoprazole, when he sticks to the strict no intake within 2 hours of bedtime he has no symptoms, and only mild symptoms if he drinks a beer or eats within 2 hours of laying down.  I reviewed the past medical history, family history, social history, surgical history, and allergies today and no changes were needed.  Please see the problem list section below in epic for further details.  Past Medical History: No past medical history on file. Past Surgical History: No past surgical history on file. Social History: Social History   Socioeconomic History  . Marital status: Single    Spouse name: Not on file  . Number of children: Not on file  . Years of education: Not on file  . Highest education level: Not on file  Occupational History  . Not on file  Social Needs  . Financial resource strain: Not on file  . Food insecurity:    Worry: Not on file    Inability: Not on file  . Transportation needs:    Medical: Not on file    Non-medical: Not on file  Tobacco Use  . Smoking status: Former Smoker    Types: Cigarettes    Last attempt to quit: 09/12/2016    Years since quitting: 1.3  . Smokeless tobacco: Never Used  Substance and Sexual Activity  . Alcohol use: Never    Frequency: Never  . Drug use: No  . Sexual activity: Yes    Partners: Female  Lifestyle  . Physical activity:    Days per week: Not on file    Minutes per session: Not on file  . Stress: Not on file  Relationships  . Social connections:    Talks on phone: Not on file    Gets together: Not on file    Attends religious service: Not on  file    Active member of club or organization: Not on file    Attends meetings of clubs or organizations: Not on file    Relationship status: Not on file  Other Topics Concern  . Not on file  Social History Narrative  . Not on file   Family History: Family History  Problem Relation Age of Onset  . Depression Mother   . Hypercholesterolemia Mother   . Hypertension Father   . Depression Sister    Allergies: No Known Allergies Medications: See med rec.  Review of Systems: No fevers, chills, night sweats, weight loss, chest pain, or shortness of breath.   Objective:    General: Well Developed, well nourished, and in no acute distress.  Neuro: Alert and oriented x3, extra-ocular muscles intact, sensation grossly intact.  HEENT: Normocephalic, atraumatic, pupils equal round reactive to light, neck supple, no masses, no lymphadenopathy, thyroid nonpalpable.  Skin: Warm and dry, no rashes. Cardiac: Regular rate and rhythm, no murmurs rubs or gallops, no lower extremity edema.  Respiratory: Clear to auscultation bilaterally. Not using accessory muscles, speaking in full sentences.  Impression and Recommendations:    Anxiety and depression Depressive symptoms now completely resolved after increasing to 100 mg of Zoloft. Only minimal anxiety symptoms  in social situations. Rather than adding a medicine for as needed use he would like to go up on the Zoloft. Increasing to 150 mg, follow-up in 1 month. Repeat PHQ and GAD.  GERD (gastroesophageal reflux disease) Occasionally will have a beer or food within 2 hours of bedtime but when he sticks to the strict 2-hour no intake before bedtime rule he has no further acid reflux symptoms with the Protonix. ___________________________________________ Ihor Austin. Benjamin Stain, M.D., ABFM., CAQSM. Primary Care and Sports Medicine [facility]  Adjunct Professor of Family Medicine  [employer]

## 2018-01-11 ENCOUNTER — Ambulatory Visit: Payer: BLUE CROSS/BLUE SHIELD | Admitting: Sports Medicine

## 2018-05-19 ENCOUNTER — Other Ambulatory Visit: Payer: Self-pay | Admitting: Sports Medicine

## 2018-05-19 DIAGNOSIS — F419 Anxiety disorder, unspecified: Principal | ICD-10-CM

## 2018-05-19 DIAGNOSIS — F329 Major depressive disorder, single episode, unspecified: Secondary | ICD-10-CM

## 2018-09-22 ENCOUNTER — Other Ambulatory Visit: Payer: Self-pay | Admitting: Sports Medicine

## 2018-09-22 DIAGNOSIS — F419 Anxiety disorder, unspecified: Secondary | ICD-10-CM

## 2018-09-22 DIAGNOSIS — F329 Major depressive disorder, single episode, unspecified: Secondary | ICD-10-CM

## 2018-10-23 ENCOUNTER — Ambulatory Visit: Payer: BLUE CROSS/BLUE SHIELD | Admitting: Sports Medicine

## 2018-10-23 ENCOUNTER — Other Ambulatory Visit: Payer: Self-pay

## 2018-10-23 ENCOUNTER — Encounter: Payer: Self-pay | Admitting: Sports Medicine

## 2018-10-23 DIAGNOSIS — S91312A Laceration without foreign body, left foot, initial encounter: Secondary | ICD-10-CM

## 2018-10-23 MED ORDER — CEPHALEXIN 500 MG PO CAPS: 500.0000 mg | ORAL_CAPSULE | Freq: Three times a day (TID) | ORAL | 0 refills | Status: DC

## 2018-10-23 NOTE — Assessment & Plan Note (Signed)
Primary closure of laceration on the plantar fifth MTP, I used Dermabond to close a laceration on the fifth toe. Ibuprofen for pain. Adding Keflex 3 times daily. He is up-to-date on tetanus. Change dressing daily. Return to remove sutures in 10 days.

## 2018-10-23 NOTE — Progress Notes (Signed)
Subjective:    CC: Laceration  HPI: This is a pleasant 28 year old male, he stepped on a ladder today, he lacerated his foot.  Pain is severe, persistent, there is significant bleeding, he is here for further evaluation and definitive treatment.  I reviewed the past medical history, family history, social history, surgical history, and allergies today and no changes were needed.  Please see the problem list section below in epic for further details.  Past Medical History: No past medical history on file. Past Surgical History: No past surgical history on file. Social History: Social History   Socioeconomic History  . Marital status: Single    Spouse name: Not on file  . Number of children: Not on file  . Years of education: Not on file  . Highest education level: Not on file  Occupational History  . Not on file  Social Needs  . Financial resource strain: Not on file  . Food insecurity    Worry: Not on file    Inability: Not on file  . Transportation needs    Medical: Not on file    Non-medical: Not on file  Tobacco Use  . Smoking status: Former Smoker    Types: Cigarettes    Quit date: 09/12/2016    Years since quitting: 2.1  . Smokeless tobacco: Never Used  Substance and Sexual Activity  . Alcohol use: Never    Frequency: Never  . Drug use: No  . Sexual activity: Yes    Partners: Female  Lifestyle  . Physical activity    Days per week: Not on file    Minutes per session: Not on file  . Stress: Not on file  Relationships  . Social Musicianconnections    Talks on phone: Not on file    Gets together: Not on file    Attends religious service: Not on file    Active member of club or organization: Not on file    Attends meetings of clubs or organizations: Not on file    Relationship status: Not on file  Other Topics Concern  . Not on file  Social History Narrative  . Not on file   Family History: Family History  Problem Relation Age of Onset  . Depression Mother    . Hypercholesterolemia Mother   . Hypertension Father   . Depression Sister    Allergies: No Known Allergies Medications: See med rec.  Review of Systems: No fevers, chills, night sweats, weight loss, chest pain, or shortness of breath.   Objective:    General: Well Developed, well nourished, and in no acute distress.  Neuro: Alert and oriented x3, extra-ocular muscles intact, sensation grossly intact.  HEENT: Normocephalic, atraumatic, pupils equal round reactive to light, neck supple, no masses, no lymphadenopathy, thyroid nonpalpable.  Skin: Warm and dry, no rashes. Cardiac: Regular rate and rhythm, no murmurs rubs or gallops, no lower extremity edema.  Respiratory: Clear to auscultation bilaterally. Not using accessory muscles, speaking in full sentences. Left foot: There is a laceration through the skin and into the subcutaneous tissues on the plantar MTP, there is also a small laceration in the skin over the toe pad of the fifth toe as well.  Laceration repair: #1 Indication: bleeding Location: Left plantar fifth MTP Size: 1.5 cm Anesthesia: 1%lidocaine with epi, good effect Wound explored, irrigated, FB removed (if present) Type of suture material: 5-0 Ethilon Number of sutures: 4 Tolerated well Routine postprocedure instructions d/w pt- keep area clean and bandaged, follow up if  concerns/spreading erythema/pain.   Follow up for for suture removal.  Laceration repair: #2 Indication: bleeding Location: Left fifth toe pad Size: 1 cm Anesthesia: 1%lidocaine no epi, good effect Wound explored, irrigated, FB removed (if present) Dermabond used to approximate edges Tolerated well Routine postprocedure instructions d/w pt- keep area clean and bandaged, follow up if concerns/spreading erythema/pain.    Impression and Recommendations:    Laceration of foot, left Primary closure of laceration on the plantar fifth MTP, I used Dermabond to close a laceration on the fifth toe.  Ibuprofen for pain. Adding Keflex 3 times daily. He is up-to-date on tetanus. Change dressing daily. Return to remove sutures in 10 days.   ___________________________________________ Gwen Her. Dianah Field, M.D., ABFM., CAQSM. Primary Care and Sports Medicine [facility]  Adjunct Professor of [employer] [facility] [employer] [facility] [employer] [facility] [employer]

## 2018-10-23 NOTE — Patient Instructions (Signed)
Laceration Care, Adult °A laceration is a cut that may go through all layers of the skin and into the tissue that is right under the skin. Some lacerations heal on their own. Others need to be closed with stitches (sutures), staples, skin adhesive strips, or skin glue. Proper care of a laceration reduces the risk for infection, helps the laceration heal better, and may prevent scarring. °How to care for your laceration °Wash your hands with soap and water before touching your wound or changing your bandage (dressing). If soap and water are not available, use hand sanitizer. °Keep the wound clean and dry. °If you were given a dressing, you should change it at least once a day, or as told by your health care provider. You should also change it if it becomes wet or dirty. °If sutures or staples were used: °· Keep the wound completely dry for the first 24 hours, or as told by your health care provider. After that time, you may shower or bathe. However, make sure that the wound is not soaked in water until after the sutures or staples have been removed. °· Clean the wound once each day, or as told by your health care provider: °? Wash the wound with soap and water. °? Rinse the wound with water to remove all soap. °? Pat the wound dry with a clean towel. Do not rub the wound. °· After cleaning the wound, apply a thin layer of antibiotic ointment as told by your health care provider. This will help prevent infection and keep the dressing from sticking to the wound. °· Have the sutures or staples removed as told by your health care provider. °If skin adhesive strips were used: °· Do not get the skin adhesive strips wet. You may shower or bathe, but be careful to keep the wound dry. °· If the wound gets wet, pat it dry with a clean towel. Do not rub the wound. °· Skin adhesive strips fall off on their own. You may trim the strips as the wound heals. Do not remove skin adhesive strips that are still stuck to the wound. They  will fall off in time. °If skin glue was used: °· Try to keep the wound dry, but you may briefly wet it in the shower or bath. Do not soak the wound in water, such as by swimming. °· After you have showered or bathed, gently pat the wound dry with a clean towel. Do not rub the wound. °· Do not do any activities that will make you sweat heavily until the skin glue has fallen off on its own. °· Do not apply liquid, cream, or ointment medicine to the wound while the skin glue is in place. Using those may loosen the film before the wound has healed. °· If a dressing is placed over the wound, be careful not to apply tape directly over the skin glue. Doing that may cause the glue to be pulled off before the wound has healed. °· Do not pick at the glue. Skin glue usually remains in place for 5-10 days and then falls off the skin. °General instructions ° °· Take over-the-counter and prescription medicines only as told by your health care provider. °· If you were prescribed an antibiotic medicine or ointment, take or apply it as told by your health care provider. Do not stop using it even if your condition improves. °· Do not scratch or pick at the wound. °· Check your wound every day for signs of   infection. Watch for: °? Redness, swelling, or pain. °? Fluid, blood, or pus. °· Raise (elevate) the injured area above the level of your heart while you are sitting or lying down for the first 24-48 hours after the laceration is repaired. °· If directed, put ice on the affected area: °? Put ice in a plastic bag. °? Place a towel between your skin and the bag. °? Leave the ice on for 20 minutes, 2-3 times a day. °· Keep all follow-up visits as told by your health care provider. This is important. °Contact a health care provider if: °· You received a tetanus shot and you have swelling, severe pain, redness, or bleeding at the injection site. °· You have a fever. °· A wound that was closed breaks open. °· You notice a bad smell  coming from your wound or your dressing. °· You notice something coming out of the wound, such as wood or glass. °· Your pain is not controlled with medicine. °· You have increased redness, swelling, or pain at the site of your wound. °· You have fluid, blood, or pus coming from your wound. °· You need to change the dressing often due to fluid, blood, or pus that is draining from the wound. °· You develop a new rash. °· You develop numbness around the wound. °Get help right away if: °· You develop severe swelling around the wound. °· Your pain suddenly increases and is severe. °· You develop painful lumps near the wound or on skin anywhere else on your body. °· You have a red streak going away from your wound. °· The wound is on your hand or foot and you cannot properly move a finger or toe. °· The wound is on your hand or foot, and you notice that your fingers or toes look pale or bluish. °Summary °· A laceration is a cut that may go through all layers of the skin and into the tissue that is right under the skin. °· Some lacerations heal on their own. Others need to be closed with stitches (sutures), staples, skin adhesive strips, or skin glue. °· Proper care of a laceration reduces the risk of infection, helps the laceration heal better, and prevents scarring. °This information is not intended to replace advice given to you by your health care provider. Make sure you discuss any questions you have with your health care provider. °Document Released: 02/27/2005 Document Revised: 04/27/2017 Document Reviewed: 03/19/2017 °Elsevier Patient Education © 2020 [employer] ° °

## 2019-01-31 ENCOUNTER — Other Ambulatory Visit: Payer: Self-pay | Admitting: Sports Medicine

## 2019-01-31 DIAGNOSIS — F32A Depression, unspecified: Secondary | ICD-10-CM

## 2019-01-31 DIAGNOSIS — F419 Anxiety disorder, unspecified: Secondary | ICD-10-CM

## 2019-01-31 DIAGNOSIS — F329 Major depressive disorder, single episode, unspecified: Secondary | ICD-10-CM

## 2019-01-31 MED ORDER — SERTRALINE HCL 50 MG PO TABS: 150.0000 mg | ORAL_TABLET | Freq: Every day | ORAL | 3 refills | Status: DC

## 2019-05-24 ENCOUNTER — Other Ambulatory Visit: Payer: Self-pay | Admitting: Sports Medicine

## 2019-05-24 DIAGNOSIS — F329 Major depressive disorder, single episode, unspecified: Secondary | ICD-10-CM

## 2019-05-24 DIAGNOSIS — F32A Depression, unspecified: Secondary | ICD-10-CM

## 2019-06-12 ENCOUNTER — Ambulatory Visit: Payer: BLUE CROSS/BLUE SHIELD

## 2019-06-21 ENCOUNTER — Ambulatory Visit: Payer: BLUE CROSS/BLUE SHIELD

## 2019-09-18 ENCOUNTER — Other Ambulatory Visit: Payer: Self-pay | Admitting: Sports Medicine

## 2019-09-18 DIAGNOSIS — F32A Depression, unspecified: Secondary | ICD-10-CM

## 2019-11-17 IMAGING — DX DG CHEST 2V
2 series · 2 of 2 positions shown · non-contrast
Comparison: None.

CLINICAL DATA: 27-year-old male with cough for the past 10 days and
new onset fever

EXAM:
CHEST - 2 VIEW

[chest pa]
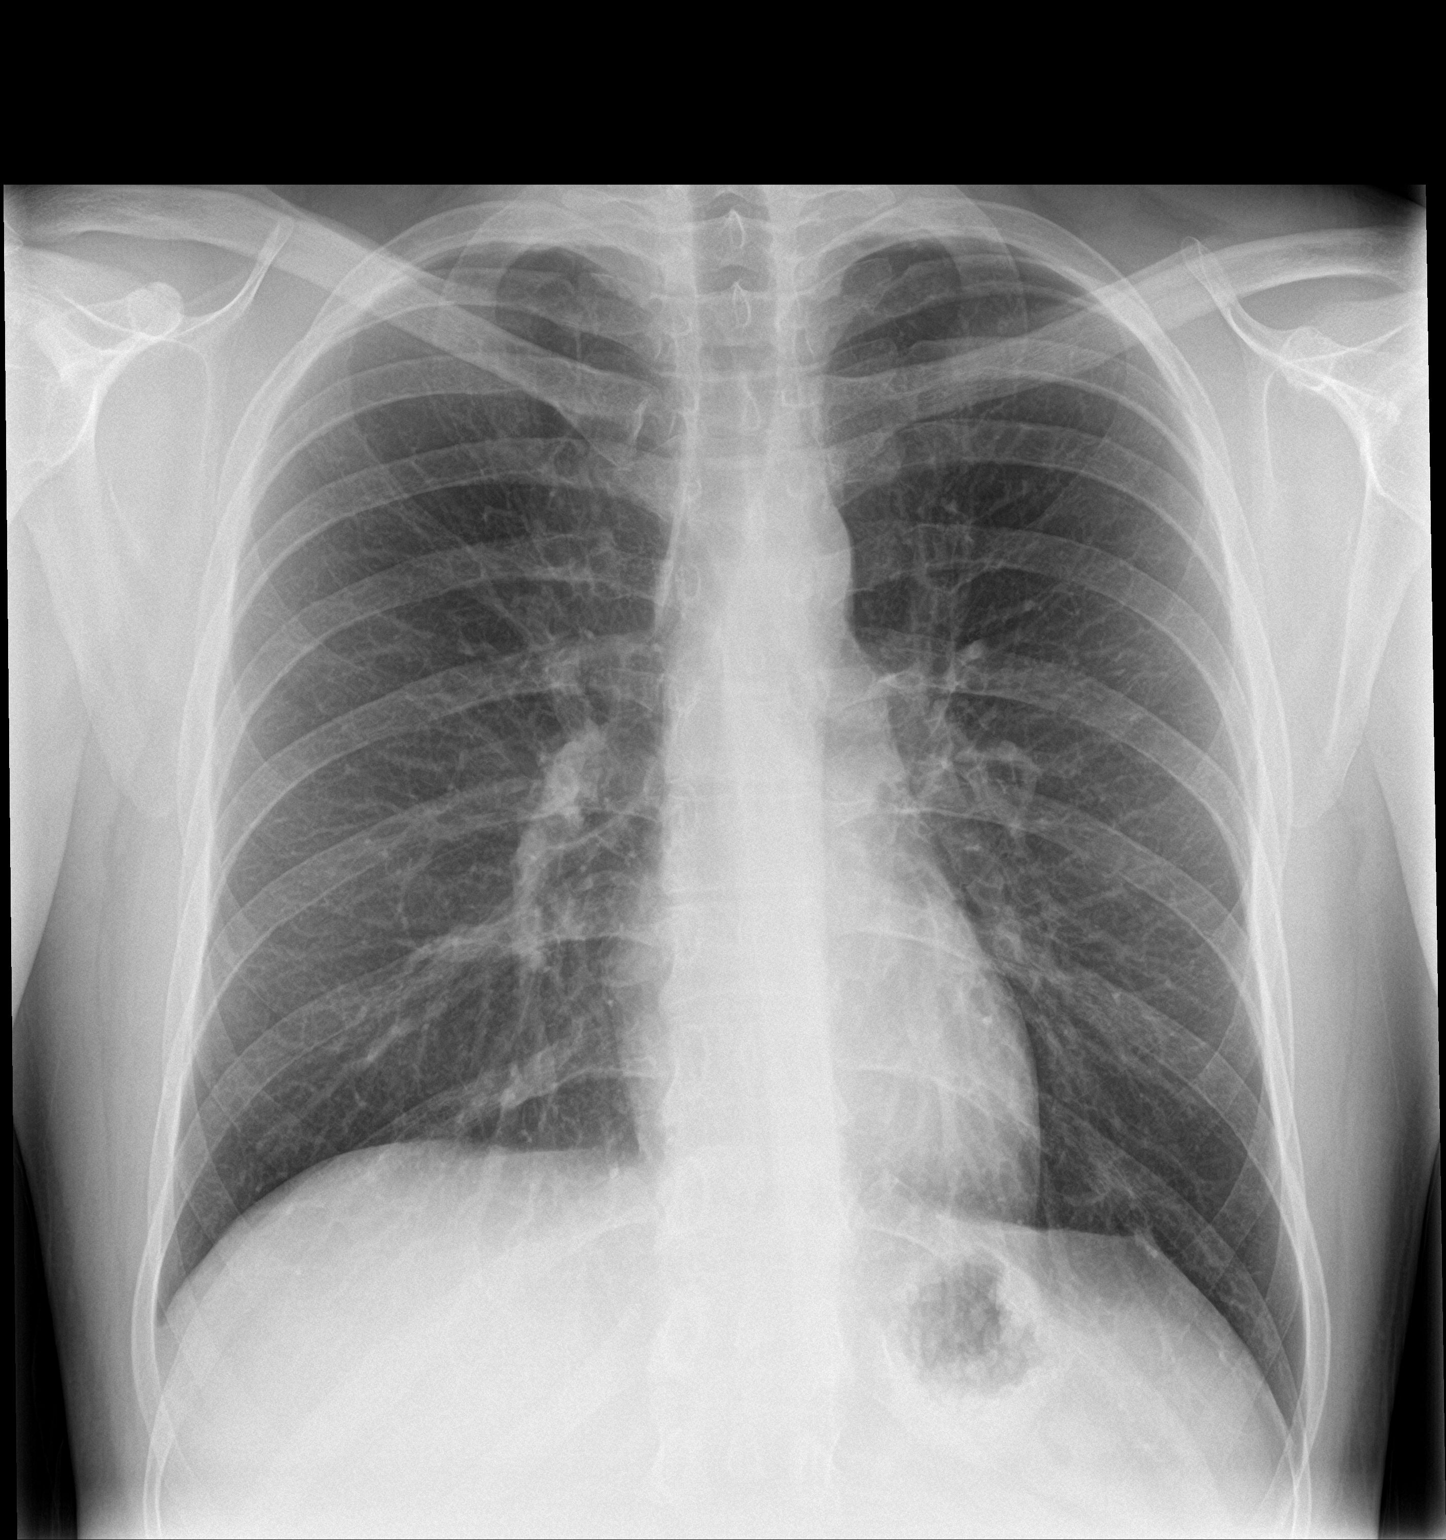

[chest lat]
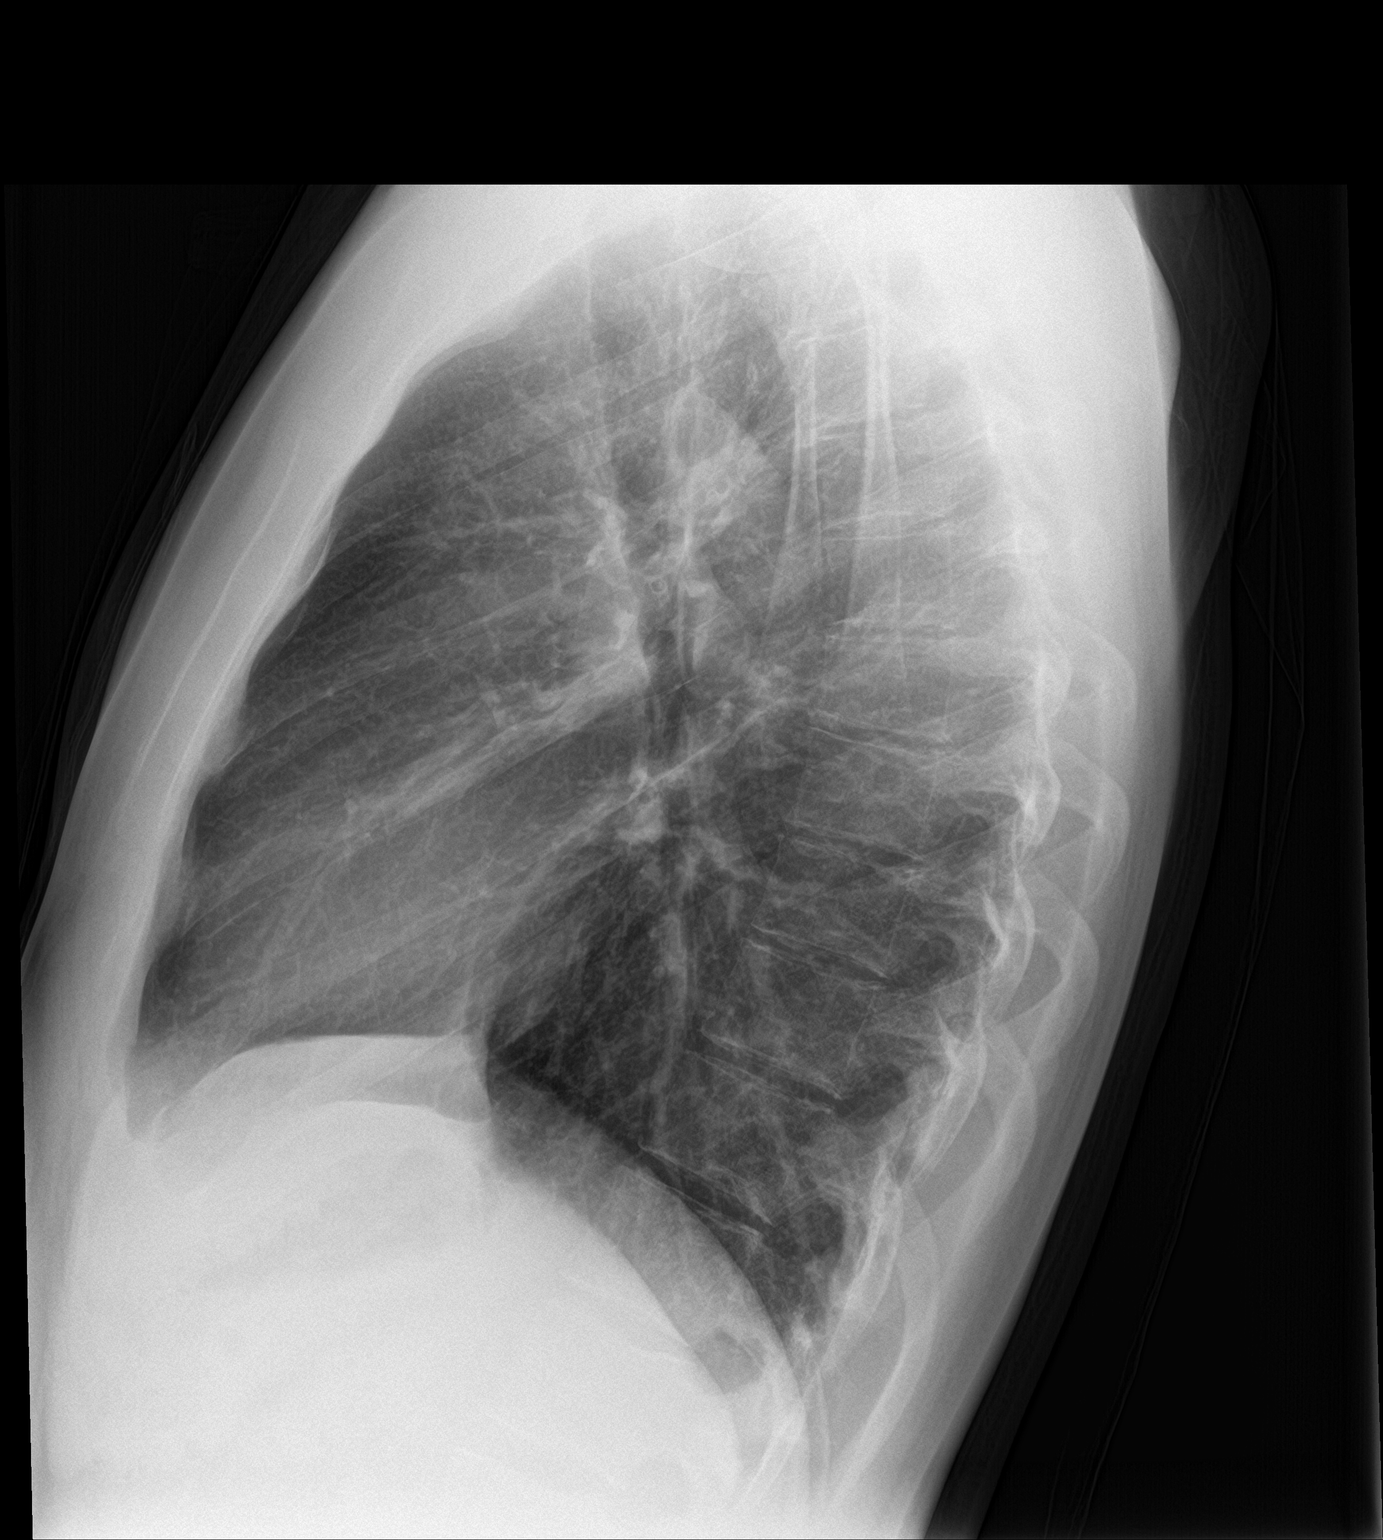

[2 of 2 positions shown; findings below may reference images not displayed]

FINDINGS: The lungs are clear and negative for focal airspace consolidation,
pulmonary edema or suspicious pulmonary nodule. No pleural effusion
or pneumothorax. Cardiac and mediastinal contours are within normal
limits. No acute fracture or lytic or blastic osseous lesions. The
visualized upper abdominal bowel gas pattern is unremarkable.
IMPRESSION: Negative chest x-ray.

## 2020-02-19 ENCOUNTER — Other Ambulatory Visit: Payer: Self-pay | Admitting: Sports Medicine

## 2020-02-19 DIAGNOSIS — F32A Depression, unspecified: Secondary | ICD-10-CM

## 2020-02-19 DIAGNOSIS — F419 Anxiety disorder, unspecified: Secondary | ICD-10-CM

## 2020-02-19 NOTE — Telephone Encounter (Signed)
Routing to covering provider.  °

## 2020-03-19 ENCOUNTER — Other Ambulatory Visit: Payer: Self-pay | Admitting: Sports Medicine

## 2020-03-19 DIAGNOSIS — F32A Depression, unspecified: Secondary | ICD-10-CM

## 2020-04-19 ENCOUNTER — Other Ambulatory Visit: Payer: Self-pay | Admitting: Sports Medicine

## 2020-04-19 DIAGNOSIS — F419 Anxiety disorder, unspecified: Secondary | ICD-10-CM

## 2020-07-19 ENCOUNTER — Other Ambulatory Visit: Payer: Self-pay | Admitting: Sports Medicine

## 2020-07-19 DIAGNOSIS — F32A Depression, unspecified: Secondary | ICD-10-CM

## 2020-08-19 ENCOUNTER — Other Ambulatory Visit: Payer: Self-pay | Admitting: Sports Medicine

## 2020-08-19 DIAGNOSIS — F419 Anxiety disorder, unspecified: Secondary | ICD-10-CM

## 2022-05-04 ENCOUNTER — Encounter: Payer: Self-pay | Admitting: Emergency Medicine

## 2022-05-04 ENCOUNTER — Ambulatory Visit
Admission: EM | Admit: 2022-05-04 | Discharge: 2022-05-04 | Disposition: A | Discharge status: Home / Self Care | Payer: BC Managed Care – PPO | Attending: Family Medicine | Admitting: Family Medicine

## 2022-05-04 DIAGNOSIS — R030 Elevated blood-pressure reading, without diagnosis of hypertension: Secondary | ICD-10-CM

## 2022-05-04 DIAGNOSIS — J101 Influenza due to other identified influenza virus with other respiratory manifestations: Secondary | ICD-10-CM

## 2022-05-04 HISTORY — DX: Attention-deficit hyperactivity disorder, unspecified type: F90.9

## 2022-05-04 HISTORY — DX: Hyperlipidemia, unspecified: E78.5

## 2022-05-04 LAB — POCT INFLUENZA A/B
Influenza A, POC: NEGATIVE
Influenza B, POC: POSITIVE — AB

## 2022-05-04 LAB — POC SARS CORONAVIRUS 2 AG -  ED: SARS Coronavirus 2 Ag: NEGATIVE

## 2022-05-04 MED ORDER — IBUPROFEN 800 MG PO TABS
800.0000 mg | ORAL_TABLET | Freq: Once | ORAL | Status: AC
  Administered 2022-05-04: 800 mg via ORAL

## 2022-05-04 NOTE — ED Triage Notes (Signed)
Sore throat beginning Monday and progressing into nasal congestion, headache, ear fullness, and cough. Denies fever, chills, CP, SOB, palpitations, wheezing, abdominal pain, N/V/D. Taking vitamin C at home to treat symptoms, denies any other OTC med use.

## 2022-05-04 NOTE — Discharge Instructions (Signed)
Drink lots of fluids Take Tylenol or ibuprofen for pain and fever May use over-the-counter cough and cold medicine Call for problems  Your blood pressure is mildly elevated.  You will want to check this once you have improved

## 2022-05-04 NOTE — ED Provider Notes (Signed)
[facility]    CSN: [id number] Arrival date & time: 05/04/22  1022      History   Chief Complaint Chief Complaint  Patient presents with   Sore Throat    HPI Joseph Cole is a 32 y.o. male.   HPI  Patient has been sick since Monday.  Today is his third day of illness.  He has a severe headache, congestion and cough.  Sore throat.  Shortness of breath chest pain rapid heartbeat.  He states that he has had abdominal pain.  Nausea and some loose bowels but no vomiting.  Worries that he has COVID.  He states that he has a terrible headache, and it feels similar to last time he had COVID.  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 No pertinent surgical history.     Home Medications    Prior to Admission medications   Medication Sig Start Date End Date Taking? Authorizing Provider  dexmethylphenidate (FOCALIN XR) 10 MG 24 hr capsule Take 10 mg by mouth daily. 04/13/22  Yes [provider]  rosuvastatin (CRESTOR) 10 MG tablet Take 1 tablet by mouth daily. 04/14/22  Yes [provider]  aluminum chloride (DRYSOL) 20 % external solution Apply daily at bedtime for 3 days then weekly to areas of excessive sweat. 06/22/17   Silverio Decamp, MD  pantoprazole (PROTONIX) 40 MG tablet Take 1 tablet (40 mg total) by mouth every evening. 12/07/17   Silverio Decamp, MD    Family History Family History  Problem Relation Age of Onset   Depression Mother    Hypercholesterolemia Mother    Hypertension Father    Depression Sister     Social History Social History   Tobacco Use    Smoking status: Former    Types: Cigarettes    Quit date: 09/12/2016    Years since quitting: 5.6   Smokeless tobacco: Never  Vaping Use   Vaping Use: Never used  Substance Use Topics   Alcohol use: Never   Drug use: No     Allergies   Patient has no known allergies.   Review of Systems Review of Systems See HPI  Physical Exam Triage Vital Signs ED Triage Vitals  Enc Vitals Group     BP 05/04/22 1055 (!) 153/110     Pulse Rate 05/04/22 1055 100     Resp 05/04/22 1055 16     Temp 05/04/22 1055 99.2 F (37.3 C)     Temp Source 05/04/22 1055 Oral     SpO2 05/04/22 1055 100 %     Weight --      Height --      Head Circumference --      Peak Flow --      Pain Score 05/04/22 1057 8     Pain Loc --      Pain Edu? --      Excl. in [place]? --    No data found.  Updated Vital Signs BP (!) 146/109 (BP Location: Left Arm)   Pulse 100   Temp 99.2 F (37.3  C) (Oral)   Resp 16   SpO2 100%      Physical Exam Constitutional:      General: He is not in acute distress.    Appearance: He is well-developed. He is ill-appearing.  HENT:     Head: Normocephalic and atraumatic.     Right Ear: Tympanic membrane and ear canal normal.     Left Ear: Tympanic membrane and ear canal normal.     Nose: Congestion and rhinorrhea present.     Mouth/Throat:     Pharynx: Posterior oropharyngeal erythema present.  Eyes:     Conjunctiva/sclera: Conjunctivae normal.     Pupils: Pupils are equal, round, and reactive to light.  Cardiovascular:     Rate and Rhythm: Regular rhythm. Bradycardia present.     Heart sounds: Normal heart sounds.  Pulmonary:     Effort: Pulmonary effort is normal. No respiratory distress.     Breath sounds: Normal breath sounds.  Abdominal:     General: There is no distension.     Palpations: Abdomen is soft.  Musculoskeletal:        General: Normal range of motion.     Cervical back: Normal range of motion.  Skin:    General: Skin is warm and dry.   Neurological:     Mental Status: He is alert.      UC Treatments / Results  Labs (all labs ordered are listed, but only abnormal results are displayed) Labs Reviewed  POCT INFLUENZA A/B - Abnormal; Notable for the following components:      Result Value   Influenza B, POC Positive (*)    All other components within normal limits  POC SARS CORONAVIRUS 2 AG -  ED    EKG   Radiology No results found.  Procedures Procedures (including critical care time)  Medications Ordered in UC Medications  ibuprofen (ADVIL) tablet 800 mg (800 mg Oral Given 05/04/22 1141)    Initial Impression / Assessment and Plan / UC Course  I have reviewed the triage vital signs and the nursing notes.  Pertinent labs & imaging results that were available during my care of the patient were reviewed by me and considered in my medical decision making (see chart for details).     Patient is on his fourth day of illness and is out of the window for treatment with Tamiflu.  Discussed conservative care. Final Clinical Impressions(s) / UC Diagnoses   Final diagnoses:  Elevated blood-pressure reading without diagnosis of hypertension  Influenza B     Discharge Instructions      Drink lots of fluids Take Tylenol or ibuprofen for pain and fever May use over-the-counter cough and cold medicine Call for problems  Your blood pressure is mildly elevated.  You will want to check this once you have improved     ED Prescriptions   None    PDMP not reviewed this encounter.   Raylene Everts, MD 05/04/22 [phone]
